# Patient Record
Sex: Male | Born: 1995 | Race: White | Hispanic: No | Marital: Single | Smoking: Never smoker
Health system: Southern US, Community
[De-identification: ages and names within clinical notes are randomized; demographics above are authoritative.]

---
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
## 2016-01-18 ENCOUNTER — Encounter: Payer: Self-pay | Admitting: Family Medicine

## 2016-01-18 ENCOUNTER — Ambulatory Visit (INDEPENDENT_AMBULATORY_CARE_PROVIDER_SITE_OTHER): Payer: Managed Care, Other (non HMO) | Admitting: Family Medicine

## 2016-01-18 VITALS — BP 131/82 | HR 80 | Temp 98.3°F | Resp 16

## 2016-01-18 DIAGNOSIS — K1370 Unspecified lesions of oral mucosa: Secondary | ICD-10-CM

## 2016-01-18 NOTE — Progress Notes (Signed)
Patient presents today with a nontender lesion on his left inner cheek. He states that he noticed this a week ago. He did chew tobacco for a few months this year. He has noticed a small black dot within the center of the lesion. He feels a similar type of smaller lesion on the right side. He denies any fever or chills or any upper respiratory symptoms. He does not wear a retainer at night. He does wear a mouthguard for football.   ROS: Negative except mentioned above.  Vitals as per Epic.  GENERAL: NAD HEENT: no pharyngeal erythema, no exudate, no erythema of TMs, small raised lesion felt on the left inner cheek, nontender, no discharge from the site, small dot in the center of it that looks like a puncture, the color of the mucosa looks normal, no lesions felt on the right inner cheek, no cervical LAD RESP: CTA B CARD: RRR NEURO: CN II-XII grossly intact   A/P: Lesion left inner cheek - may be result of trauma from the mouthguard he wears, will send him to dermatology to make sure there is no concerns for anything else. Patient addresses understanding and is appreciative. Discouraged chewing tobacco.

## 2016-06-19 ENCOUNTER — Ambulatory Visit: Admit: 2016-06-19 | Payer: Managed Care, Other (non HMO) | Admitting: Otolaryngology

## 2016-06-19 SURGERY — TONSILLECTOMY
Anesthesia: General | Laterality: Bilateral

## 2016-07-15 ENCOUNTER — Ambulatory Visit (INDEPENDENT_AMBULATORY_CARE_PROVIDER_SITE_OTHER): Payer: BLUE CROSS/BLUE SHIELD | Admitting: Family Medicine

## 2016-07-15 VITALS — BP 131/78 | HR 82 | Temp 98.1°F | Resp 14

## 2016-07-15 DIAGNOSIS — J020 Streptococcal pharyngitis: Secondary | ICD-10-CM

## 2016-07-15 LAB — POCT RAPID STREP A (OFFICE): RAPID STREP A SCREEN: POSITIVE — AB

## 2016-07-15 MED ORDER — AMOXICILLIN 500 MG PO CAPS: 500.0000 mg | ORAL_CAPSULE | Freq: Three times a day (TID) | ORAL | 0 refills | Status: AC

## 2016-07-15 NOTE — Progress Notes (Signed)
Patient presents today with symptoms of sore throat and swollen lymph nodes. Patient admits to symptoms starting yesterday. Denies fever, severe headache, rash, myalgias.   ROS: Negative except mentioned above. Vitals as per Epic.  GENERAL: NAD HEENT: moderate pharyngeal erythema, no exudate, no erythema of TMs, mild cervical LAD RESP: CTA B CARD: RRR NEURO: CN II-XII grossly intact   A/P: Strep. Pharyngitis - rapid strep test positive in the office, Amoxicillin for 10 days, Ibuprofen prn, rest, hydration, seek medical attention if symptoms persist or worsen as discussed. No athletic activity for 2 days and can return when afebrile without fever lowering medication. No class today. Can return to class if afebrile tomorrow without fever lowering medication.

## 2017-10-04 DIAGNOSIS — L2389 Allergic contact dermatitis due to other agents: Secondary | ICD-10-CM | POA: Diagnosis not present

## 2018-02-23 ENCOUNTER — Emergency Department
Admission: EM | Admit: 2018-02-23 | Discharge: 2018-02-23 | Disposition: A | Discharge status: Home / Self Care | Payer: BLUE CROSS/BLUE SHIELD | Attending: Emergency Medicine | Admitting: Emergency Medicine

## 2018-02-23 ENCOUNTER — Emergency Department: Payer: BLUE CROSS/BLUE SHIELD

## 2018-02-23 ENCOUNTER — Other Ambulatory Visit: Payer: Self-pay

## 2018-02-23 DIAGNOSIS — Z87891 Personal history of nicotine dependence: Secondary | ICD-10-CM | POA: Insufficient documentation

## 2018-02-23 DIAGNOSIS — Y999 Unspecified external cause status: Secondary | ICD-10-CM | POA: Diagnosis not present

## 2018-02-23 DIAGNOSIS — S61441A Puncture wound with foreign body of right hand, initial encounter: Secondary | ICD-10-CM | POA: Insufficient documentation

## 2018-02-23 DIAGNOSIS — T148XXA Other injury of unspecified body region, initial encounter: Secondary | ICD-10-CM

## 2018-02-23 DIAGNOSIS — S60552A Superficial foreign body of left hand, initial encounter: Secondary | ICD-10-CM | POA: Diagnosis not present

## 2018-02-23 DIAGNOSIS — W25XXXA Contact with sharp glass, initial encounter: Secondary | ICD-10-CM | POA: Insufficient documentation

## 2018-02-23 DIAGNOSIS — Y92009 Unspecified place in unspecified non-institutional (private) residence as the place of occurrence of the external cause: Secondary | ICD-10-CM | POA: Diagnosis not present

## 2018-02-23 DIAGNOSIS — Y93E5 Activity, floor mopping and cleaning: Secondary | ICD-10-CM | POA: Insufficient documentation

## 2018-02-23 DIAGNOSIS — S60551A Superficial foreign body of right hand, initial encounter: Secondary | ICD-10-CM | POA: Diagnosis not present

## 2018-02-23 MED ORDER — LIDOCAINE HCL (PF) 1 % IJ SOLN
5.0000 mL | Freq: Once | INTRAMUSCULAR | Status: AC
  Administered 2018-02-23: 5 mL
  Filled 2018-02-23: qty 5

## 2018-02-23 NOTE — ED Triage Notes (Signed)
Pt was cleaning glass table and has a piece of glass to right hand. States unable to remove it at home.

## 2018-02-23 NOTE — ED Provider Notes (Signed)
[facility] Emergency Department Provider Note ____________________________________________  Time seen: 2322  I have reviewed the triage vital signs and the nursing notes.  HISTORY  Chief Complaint  Foreign Body  HPI Jeff Watson is a 22 y.o. male who presents to the ED for management of a retained foreign body to the right palm. He admits to cleaning the coffee table, when he wiped his hand across the table, he had an immediate foreign body sensation to the thenar prominence.   No past medical history on file.  There are no active problems to display for this patient.  No past surgical history on file.  Prior to Admission medications   Medication Sig Start Date End Date Taking? Authorizing Provider  amoxicillin (AMOXIL) 500 MG capsule Take 1 capsule (500 mg total) by mouth 3 (three) times daily. 07/15/16   Jolene Provost, MD    Allergies Patient has no known allergies.  Family History  Problem Relation Age of Onset  . Hypertension Father   . Cancer Maternal Grandmother   . Cancer Maternal Grandfather   . Cancer Paternal Grandmother   . Cancer Paternal Grandfather     Social History Social History   Tobacco Use  . Smoking status: Never Smoker  . Smokeless tobacco: Former Engineer, water Use Topics  . Alcohol use: Yes  . Drug use: Not on file    Review of Systems  Constitutional: Negative for fever. Cardiovascular: Negative for chest pain. Respiratory: Negative for shortness of breath. Musculoskeletal: Negative for back pain. Skin: Negative for rash.  Mom foreign body as above. Neurological: Negative for headaches, focal weakness or numbness. ____________________________________________  PHYSICAL EXAM:  VITAL SIGNS: ED Triage Vitals [02/23/18 2053]  Enc Vitals Group     BP (!) 142/93     Pulse Rate 95     Resp 20     Temp 98.3 F (36.8 C)     Temp Source Oral     SpO2 100 %     Weight 180 lb (81.6 kg)     Height 5\' 8"   (1.727 m)     Head Circumference      Peak Flow      Pain Score 5     Pain Loc      Pain Edu?      Excl. in GC?     Constitutional: Alert and oriented. Well appearing and in no distress. Head: Normocephalic and atraumatic. Cardiovascular: Normal rate, regular rhythm. Normal distal pulses. Respiratory: Normal respiratory effort. Musculoskeletal: Nontender with normal gross sensation. Normal speech and language. No gross focal neurologic deficits are appreciated. Skin:  Skin is warm, dry and intact. No rash noted.  Right palm with a superficial foreign body noted over the thenar prominence. ____________________________________________   RADIOLOGY  Right Hand  IMPRESSION: No fracture, dislocation, or radiopaque foreign body is seen.  I, Rogene Meth, Charlesetta Ivory, personally viewed and evaluated these images (plain radiographs) as part of my medical decision making, as well as reviewing the written report by the radiologist. ____________________________________________  PROCEDURES  .Foreign Body Removal Date/Time: 02/23/2018 11:35 PM Performed by: Lissa Hoard, PA-C Authorized by: Lissa Hoard, PA-C  Consent: Verbal consent obtained. Written consent not obtained. Risks and benefits: risks, benefits and alternatives were discussed Consent given by: patient Patient understanding: patient states understanding of the procedure being performed Patient consent: the patient's understanding of the procedure matches consent given Procedure consent: procedure consent matches procedure scheduled Site marked: the operative  site was marked Imaging studies: imaging studies available Patient identity confirmed: verbally with patient Body area: skin General location: upper extremity Location details: right hand Anesthesia: local infiltration  Anesthesia: Local Anesthetic: lidocaine 1% without epinephrine Anesthetic total: 1 mL  Sedation: Patient sedated:  no  Patient restrained: no Patient cooperative: yes Complexity: simple 1 objects recovered. Objects recovered: glass splinter Post-procedure assessment: foreign body removed Patient tolerance: Patient tolerated the procedure well with no immediate complications  ____________________________________________  INITIAL IMPRESSION / ASSESSMENT AND PLAN / ED COURSE  Patient with ED evaluation of retained foreign body to the right palm.  Patient superficial glass splinter is removed after local anesthetic is administered.  Patient tolerated procedure well and wound is closed using Steri-Strips.  Wound care instructions are provided. ____________________________________________  FINAL CLINICAL IMPRESSION(S) / ED DIAGNOSES  Final diagnoses:  Superficial foreign body (sliver)      Karmen Stabs, Charlesetta Ivory, PA-C 02/23/18 2341    Myrna Blazer, MD 02/24/18 2226

## 2018-02-23 NOTE — Discharge Instructions (Addendum)
We have successfully removed a small glass splinter from the palm. Keep the wound clean, dry, and covered.

## 2019-11-06 IMAGING — CR DG HAND COMPLETE 3+V*R*
4 series · 4 of 4 positions shown · non-contrast
Comparison: None.

CLINICAL DATA: Foreign body/glass

EXAM:
RIGHT HAND - COMPLETE 3+ VIEW

[hand ap]
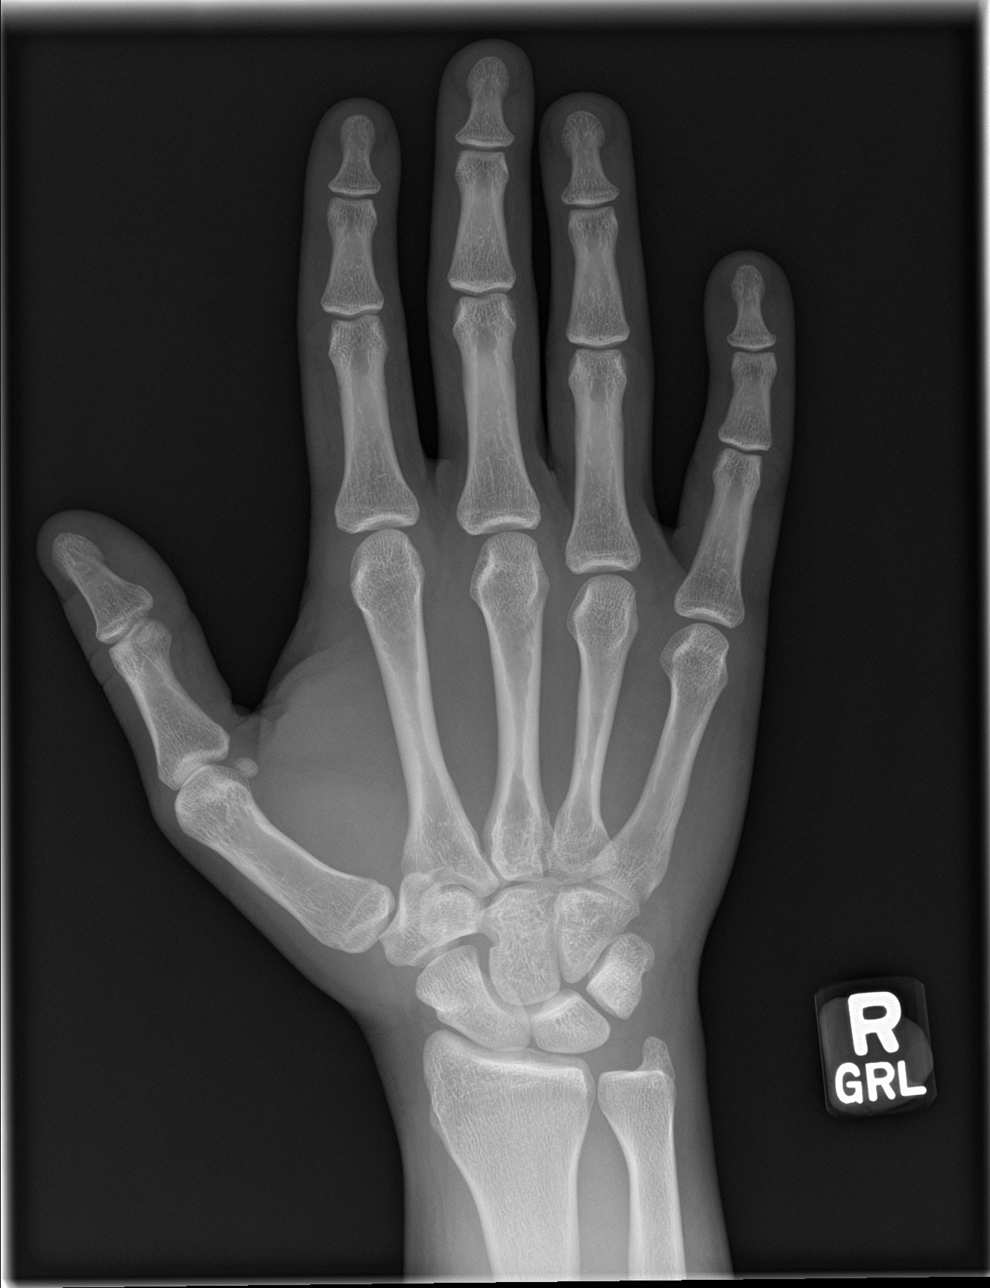

[hand obl]
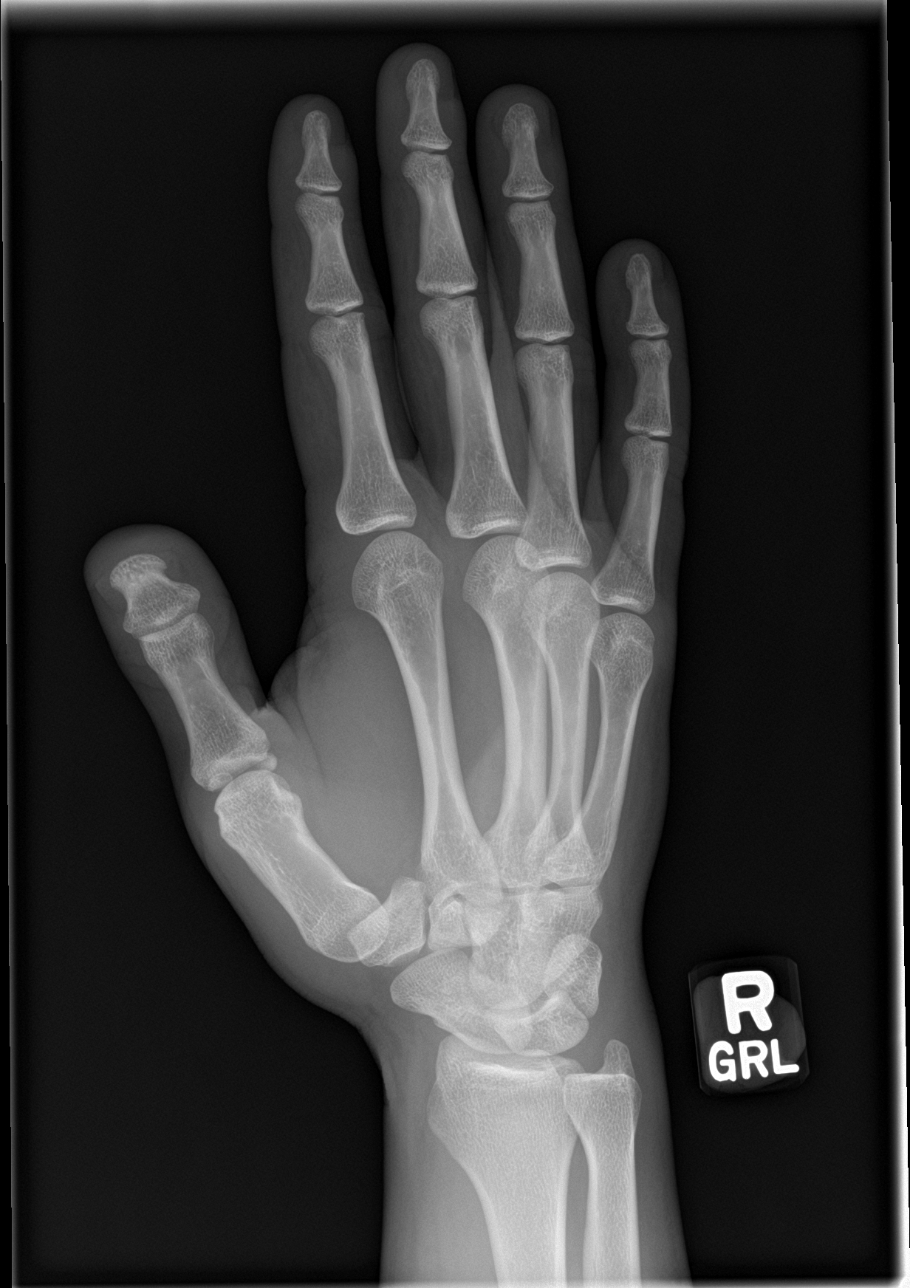

[hand lat (1 of 2)]
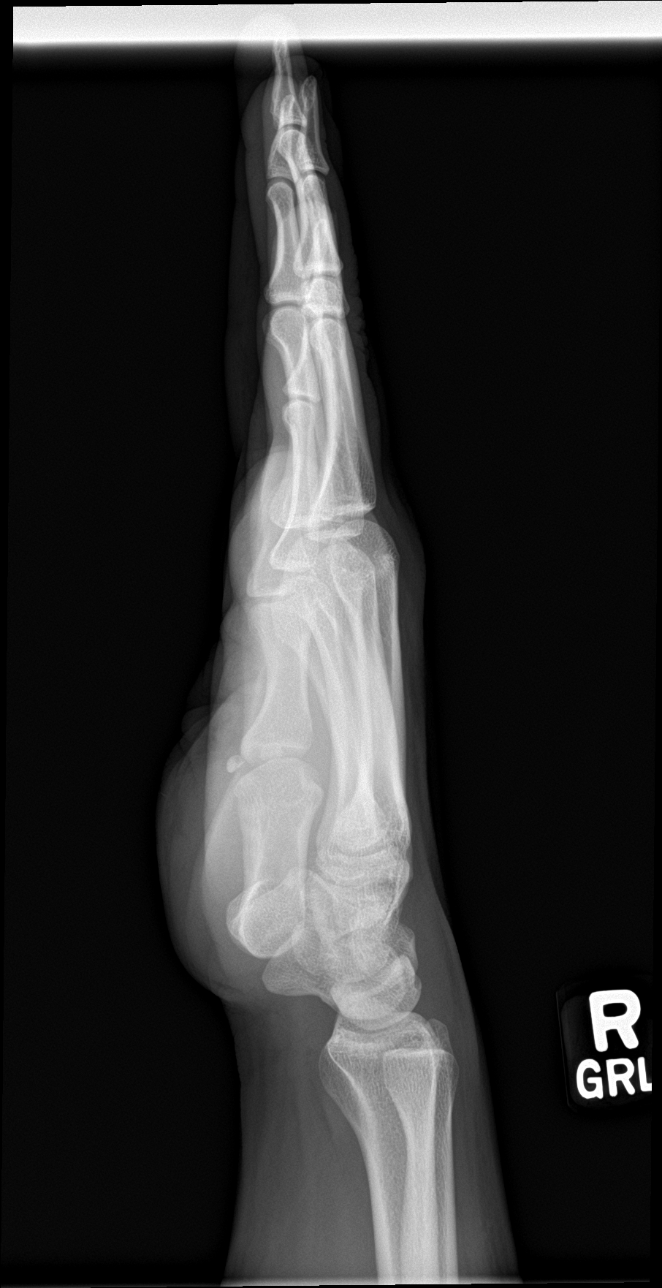

[hand lat (2 of 2)]
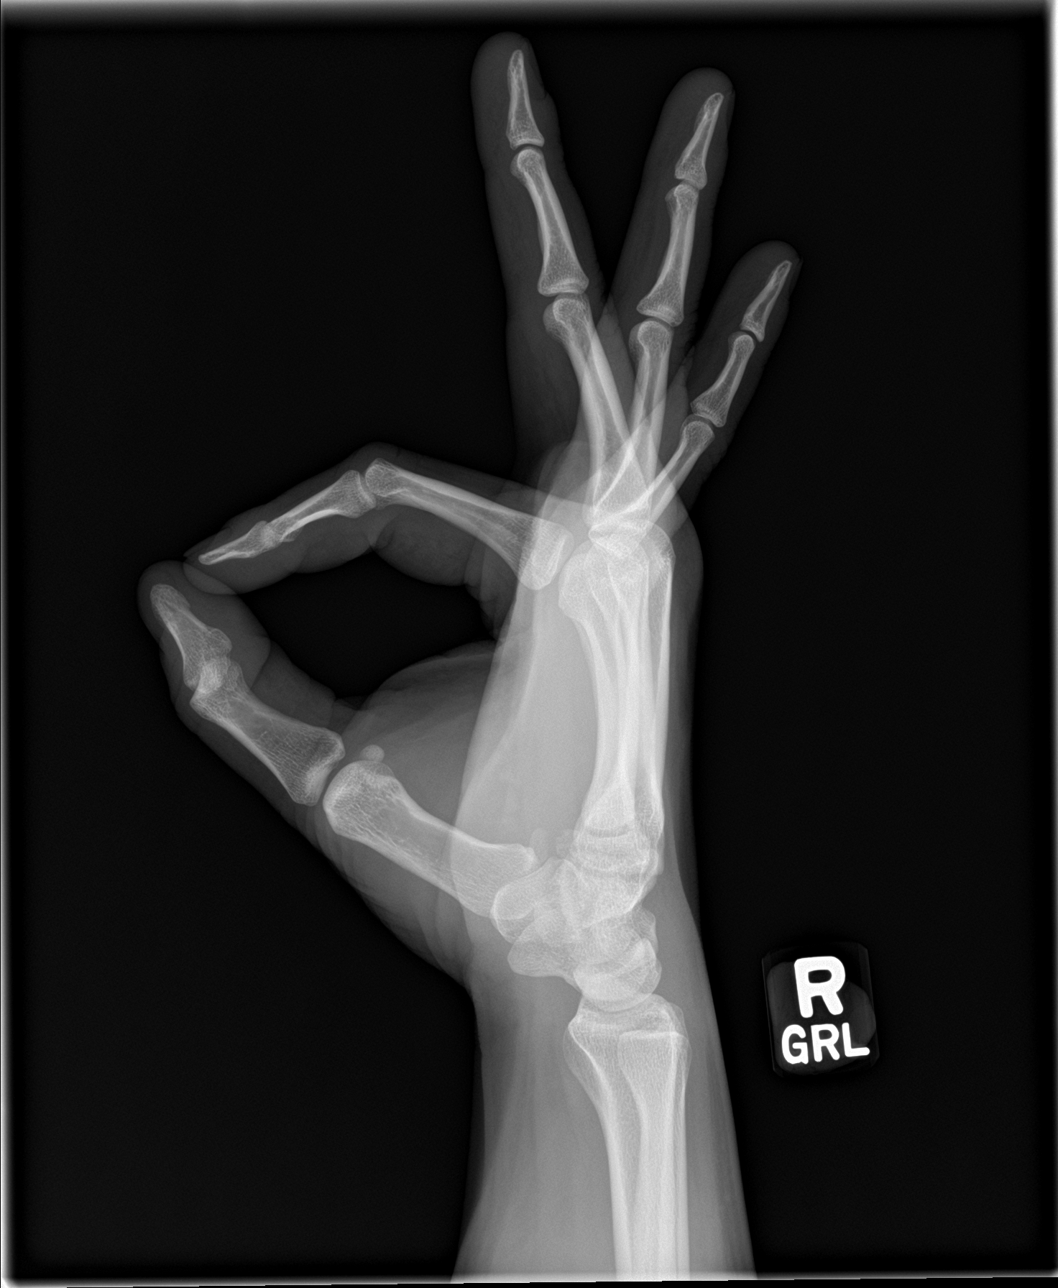

[4 of 4 positions shown; findings below may reference images not displayed]

FINDINGS: No fracture or dislocation is seen.

The joint spaces are preserved.

Visualized soft tissues are within normal limits.

No radiopaque foreign body is seen.
IMPRESSION: No fracture, dislocation, or radiopaque foreign body is seen.
